# Patient Record
Sex: Male | Born: 1985 | Race: White | Hispanic: No | Marital: Single | Smoking: Former smoker
Health system: Southern US, Community
[De-identification: ages and names within clinical notes are randomized; demographics above are authoritative.]

## PROBLEM LIST (undated history)

## (undated) DIAGNOSIS — E7800 Pure hypercholesterolemia, unspecified: Secondary | ICD-10-CM

## (undated) HISTORY — DX: Pure hypercholesterolemia, unspecified: E78.00

## (undated) HISTORY — PX: APPENDECTOMY: SHX54

---
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
## 2005-01-25 ENCOUNTER — Emergency Department (HOSPITAL_COMMUNITY): Admission: EM | Admit: 2005-01-25 | Discharge: 2005-01-25 | Payer: Self-pay | Admitting: Emergency Medicine

## 2005-02-04 ENCOUNTER — Emergency Department (HOSPITAL_COMMUNITY): Admission: EM | Admit: 2005-02-04 | Discharge: 2005-02-04 | Payer: Self-pay | Admitting: Emergency Medicine

## 2005-11-29 IMAGING — CT CT ABDOMEN W/ CM
1 of 5 series · 14 of 32 positions shown, 19 images · IV contrast (CONTRAST)
Comparison: None

ABDOMEN CT WITH CONTRAST

CLINICAL DATA: Vomiting, right lower quadrant pain
TECHNIQUE: Multidetector CT imaging of the abdomen and pelvis was performed
following the standard protocol during bolus administration of intravenous
contrast.

Contrast:  100 cc Omnipaque 300

[Series 2636: — · axial · 0.70mm/px · z∈[+1183,+1655]mm · 14 of 333 slices shown, 19 images]
[im 19/333  soft-tissue]
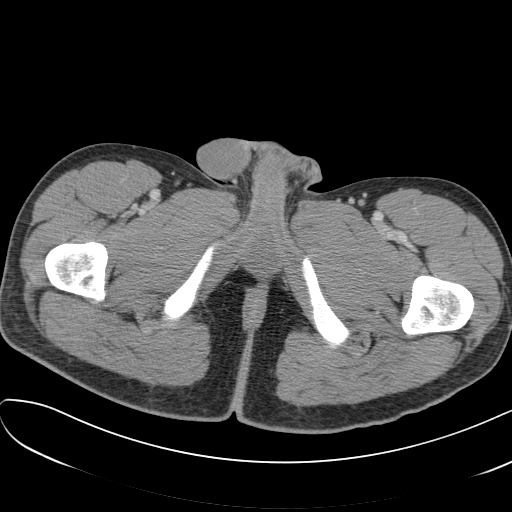
[im 19/333  bone]
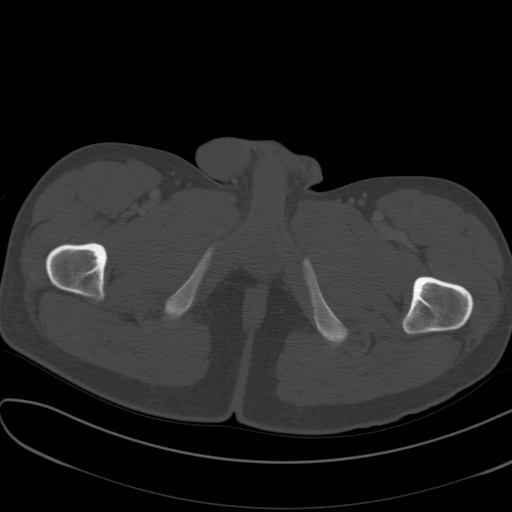
[im 37/333  soft-tissue]
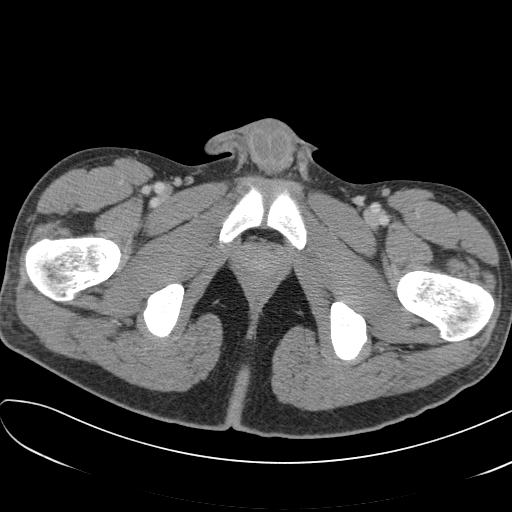
[im 74/333  soft-tissue]
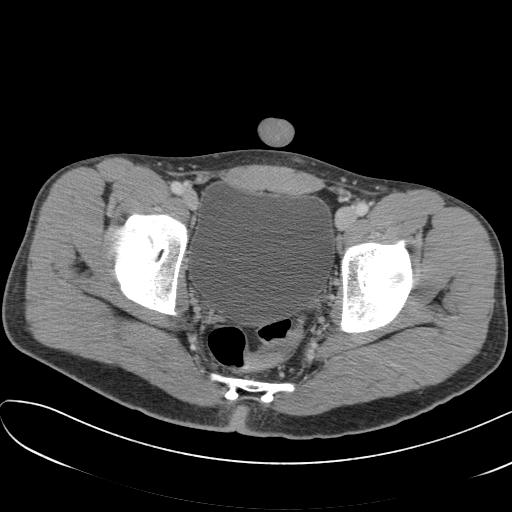
[im 93/333  soft-tissue]
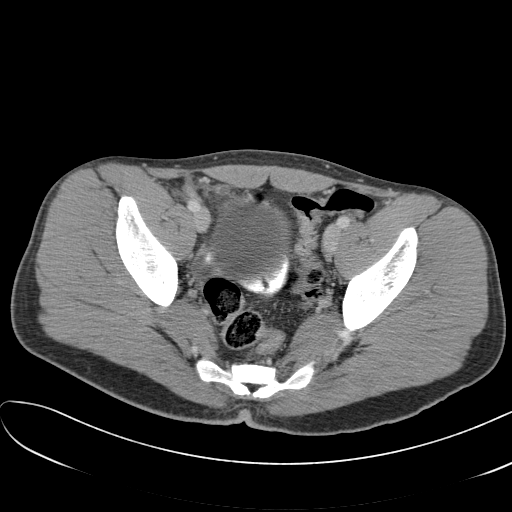
[im 111/333  soft-tissue]
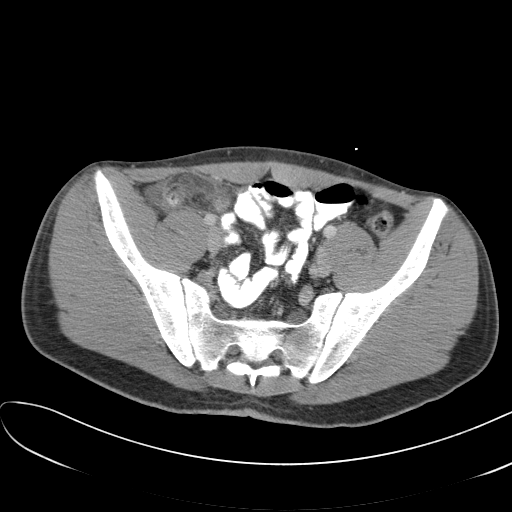
[im 148/333  soft-tissue]
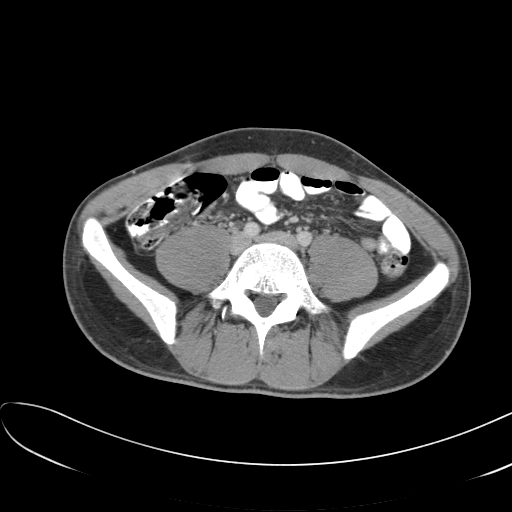
[im 167/333  soft-tissue]
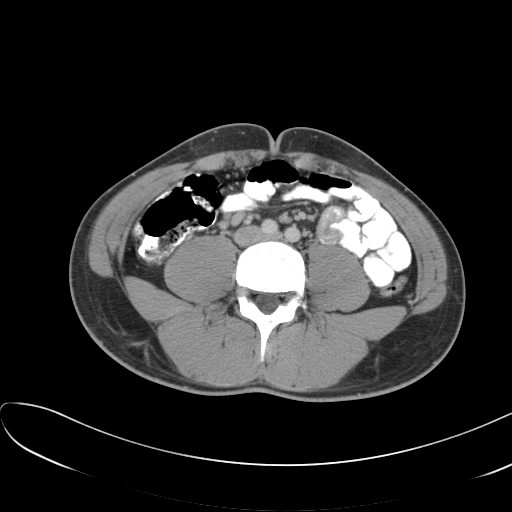
[im 185/333  soft-tissue]
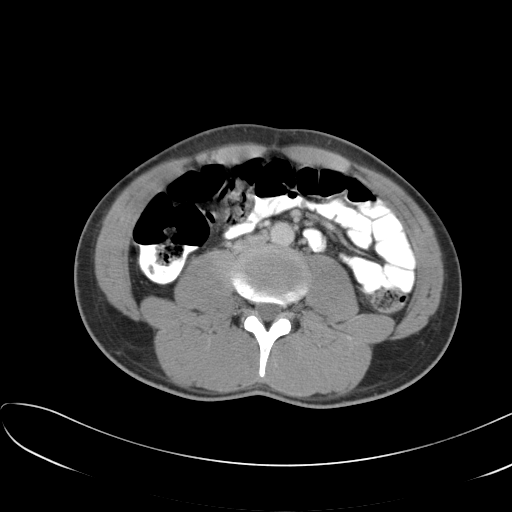
[im 222/333  soft-tissue]
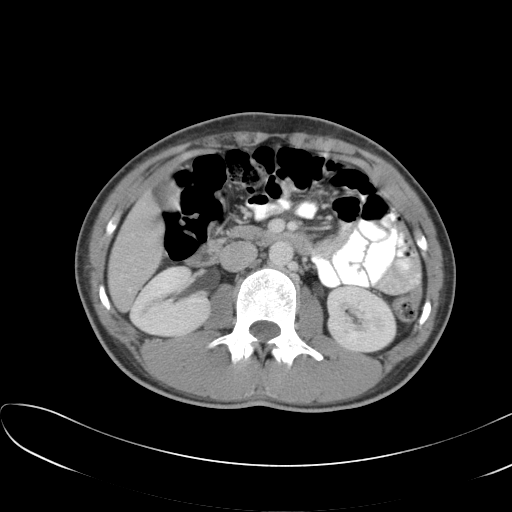
[im 222/333  bone]
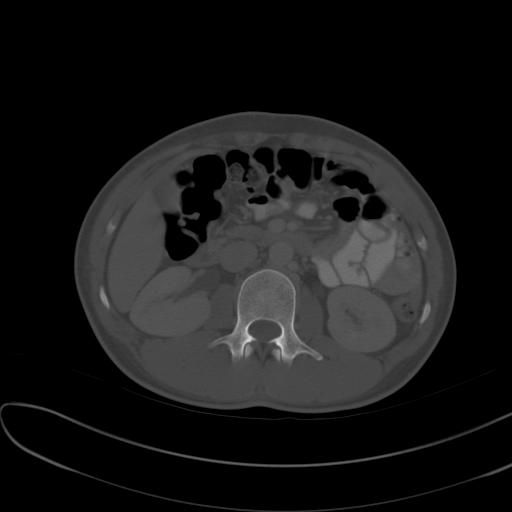
[im 240/333  soft-tissue]
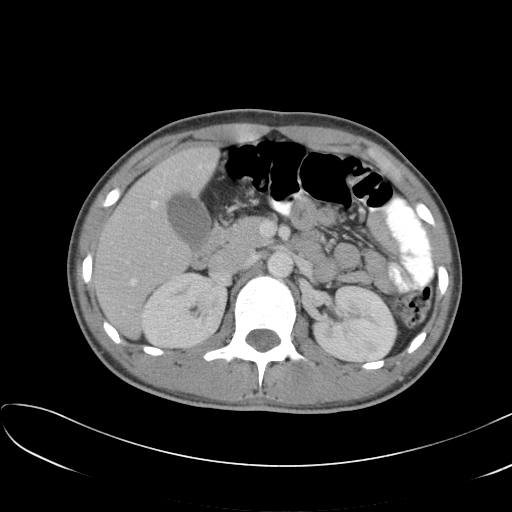
[im 259/333  soft-tissue]
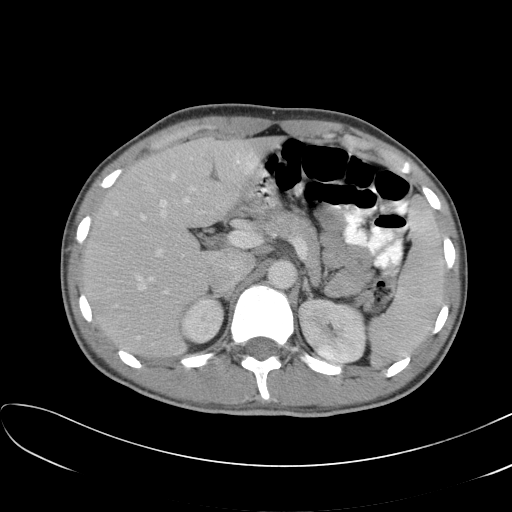
[im 259/333  lung]
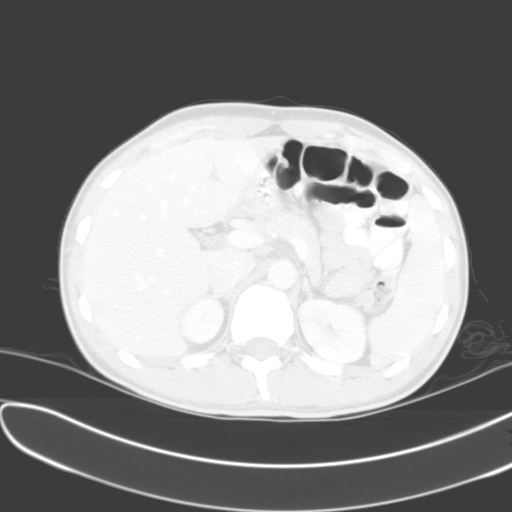
[im 277/333  lung]
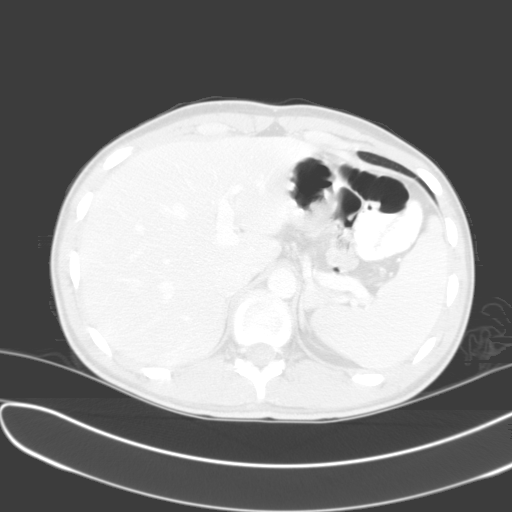
[im 296/333  soft-tissue]
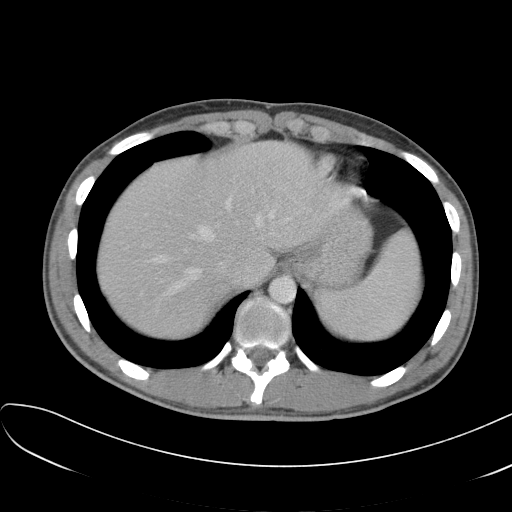
[im 296/333  lung]
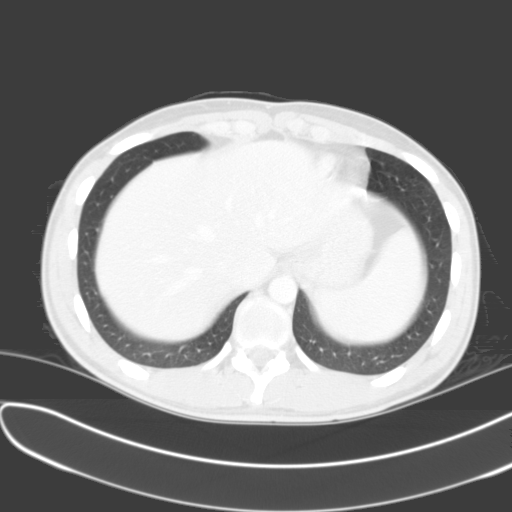
[im 314/333  soft-tissue]
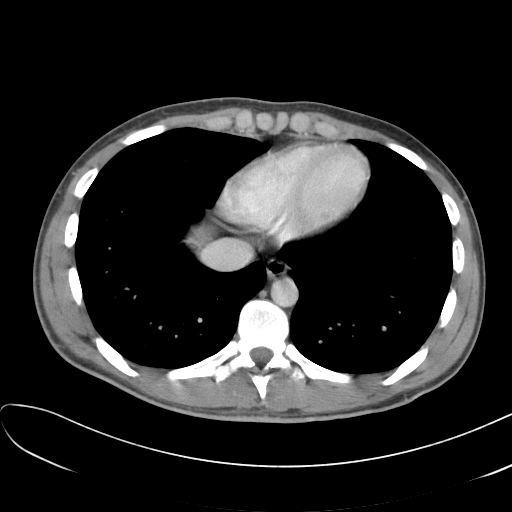
[im 314/333  lung]
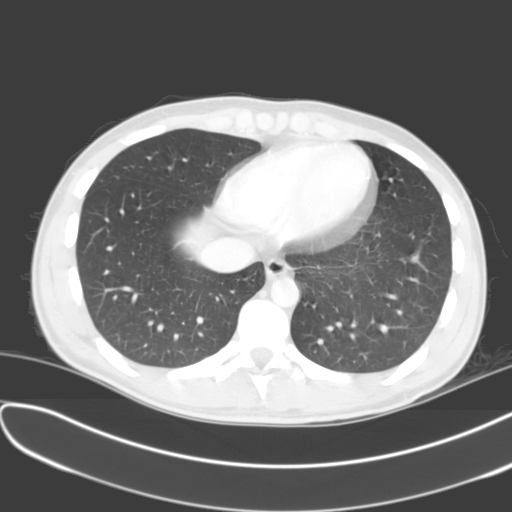

[14 of 32 positions shown; findings below may reference images not displayed]

FINDINGS: Liver, spleen, pancreas, adrenals, kidneys, gallbladder, and bowel
grossly unremarkable. No free fluid, free air, or adenopathy. Lung bases clear.

IMPRESSION

No acute findings in the abdomen.

PELVIS CT WITH CONTRAST
FINDINGS: The appendix is dilated and inflamed. Air-fluid level appears to be
present in the appendix or this may also represent a small contained
perforation. Findings compatible with appendicitis. There likely is an
appendicolith near the base of the appendix. Probable reactive thickening of the
terminal ileum. No free fluid or adenopathy.

IMPRESSION

Findings compatible with appendicitis. Probable reactive thickening of the
terminal ileum.

## 2010-10-30 ENCOUNTER — Emergency Department (HOSPITAL_COMMUNITY)
Admission: EM | Admit: 2010-10-30 | Discharge: 2010-10-31 | Disposition: A | Discharge status: Home / Self Care | Payer: Self-pay | Attending: Emergency Medicine | Admitting: Emergency Medicine

## 2010-10-30 DIAGNOSIS — R1032 Left lower quadrant pain: Secondary | ICD-10-CM | POA: Insufficient documentation

## 2010-10-30 DIAGNOSIS — R1084 Generalized abdominal pain: Secondary | ICD-10-CM

## 2010-10-30 DIAGNOSIS — F17200 Nicotine dependence, unspecified, uncomplicated: Secondary | ICD-10-CM | POA: Insufficient documentation

## 2010-10-30 LAB — URINALYSIS, ROUTINE W REFLEX MICROSCOPIC
Bilirubin Urine: NEGATIVE
Glucose, UA: NEGATIVE mg/dL
Hgb urine dipstick: NEGATIVE
Ketones, ur: NEGATIVE mg/dL
Leukocytes, UA: NEGATIVE
Nitrite: NEGATIVE
Protein, ur: NEGATIVE mg/dL
Specific Gravity, Urine: 1.02 (ref 1.005–1.030)
Urobilinogen, UA: 2 mg/dL — ABNORMAL HIGH (ref 0.0–1.0)
pH: 7.5 (ref 5.0–8.0)

## 2010-10-30 MED ORDER — IBUPROFEN 800 MG PO TABS
800.0000 mg | ORAL_TABLET | Freq: Once | ORAL | Status: AC
  Administered 2010-10-30: 800 mg via ORAL
  Filled 2010-10-30: qty 1

## 2010-10-30 MED ORDER — HYDROCODONE-ACETAMINOPHEN 5-325 MG PO TABS
1.0000 | ORAL_TABLET | Freq: Once | ORAL | Status: AC
  Administered 2010-10-30: 1 via ORAL
  Filled 2010-10-30: qty 1

## 2010-10-30 NOTE — ED Notes (Signed)
Ab pain x1 days, describes as pressure, denies any nausea, no vomiting, no diarrhea, no fever.

## 2010-10-30 NOTE — ED Provider Notes (Signed)
History     Chief Complaint  Patient presents with  . 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 He had lifted a heavy item yesterday morning and by yesterday afternoon was experiencing LLQ abdominal pain worse with movement. Took ibuprofen last night with little relief. Pain has continued to day with certain movements. Denies fever, chills, hematuria, frequncy, urgency, nausea, vomiting.  Patient is a 25 y.o. male presenting with abdominal pain. The history is provided by the patient.  Abdominal Pain The primary symptoms of the illness include abdominal pain. The current episode started yesterday.  Associated with: movement. Symptoms associated with the illness do not include constipation, hematuria or back pain.    History reviewed. No pertinent past medical history.  Past Surgical History  Procedure Date  . Appendectomy     History reviewed. No pertinent family history.  History  Substance Use Topics  . Smoking status: Current Everyday Smoker  . Smokeless tobacco: Not on file  . Alcohol Use: No      Review of Systems  Gastrointestinal: Positive for abdominal pain. Negative for constipation.  Genitourinary: Negative for hematuria.  Musculoskeletal: Negative for back pain.  All other systems reviewed and are negative.    Physical Exam  BP 123/58  Pulse 50  Temp(Src) 98.3 F (36.8 C) (Oral)  Resp 17  Ht 6\' 4"  (1.93 m)  Wt 183 lb (83.008 kg)  BMI 22.28 kg/m2  SpO2 99%  Physical Exam  Nursing note and vitals reviewed. Constitutional: He is oriented to person, place, and time. He appears well-developed and well-nourished. No distress.  HENT:  Head: Normocephalic.  Right Ear: External ear normal.  Left Ear: External ear normal.  Nose: Nose normal.  Mouth/Throat: Oropharynx is clear and moist.  Eyes: Conjunctivae and EOM are normal. Pupils are equal, round, and reactive to light.  Neck: Normal range of motion. Neck supple.    Cardiovascular: Normal rate, normal heart sounds and intact distal pulses.   Pulmonary/Chest: Effort normal and breath sounds normal.  Abdominal: Soft. He exhibits no mass. There is no rebound and no guarding.       Mild tenderness to LLQ with palpation  Genitourinary: Penis normal.  Musculoskeletal: Normal range of motion.  Neurological: He is alert and oriented to person, place, and time. He has normal reflexes.  Skin: Skin is warm and dry.    ED Course  Procedures  MDM       Nicoletta Dress. Colon Macnaughton, MD 10/30/10 [phone]

## 2010-10-31 MED ORDER — HYDROCODONE-ACETAMINOPHEN 5-500 MG PO TABS: 1.0000 | ORAL_TABLET | ORAL | Status: AC | PRN

## 2010-10-31 MED ORDER — IBUPROFEN 800 MG PO TABS: 800.0000 mg | ORAL_TABLET | Freq: Three times a day (TID) | ORAL | Status: AC

## 2010-10-31 NOTE — Progress Notes (Signed)
Patient stated that pain has improved a bit with medication.

## 2010-10-31 NOTE — ED Notes (Signed)
States he doesn't have pain while he is sitting down but pain increases when walking

## 2012-05-22 ENCOUNTER — Other Ambulatory Visit: Payer: Self-pay | Admitting: Occupational Medicine

## 2012-05-22 ENCOUNTER — Ambulatory Visit: Payer: Self-pay

## 2012-05-22 DIAGNOSIS — Z021 Encounter for pre-employment examination: Secondary | ICD-10-CM

## 2013-10-25 IMAGING — CR DG CHEST 1V
1 series · 1 of 1 positions shown · non-contrast
Comparison: None.

CLINICAL DATA: Pre-employment physical exam.

CHEST - 1 VIEW

[view not recorded]
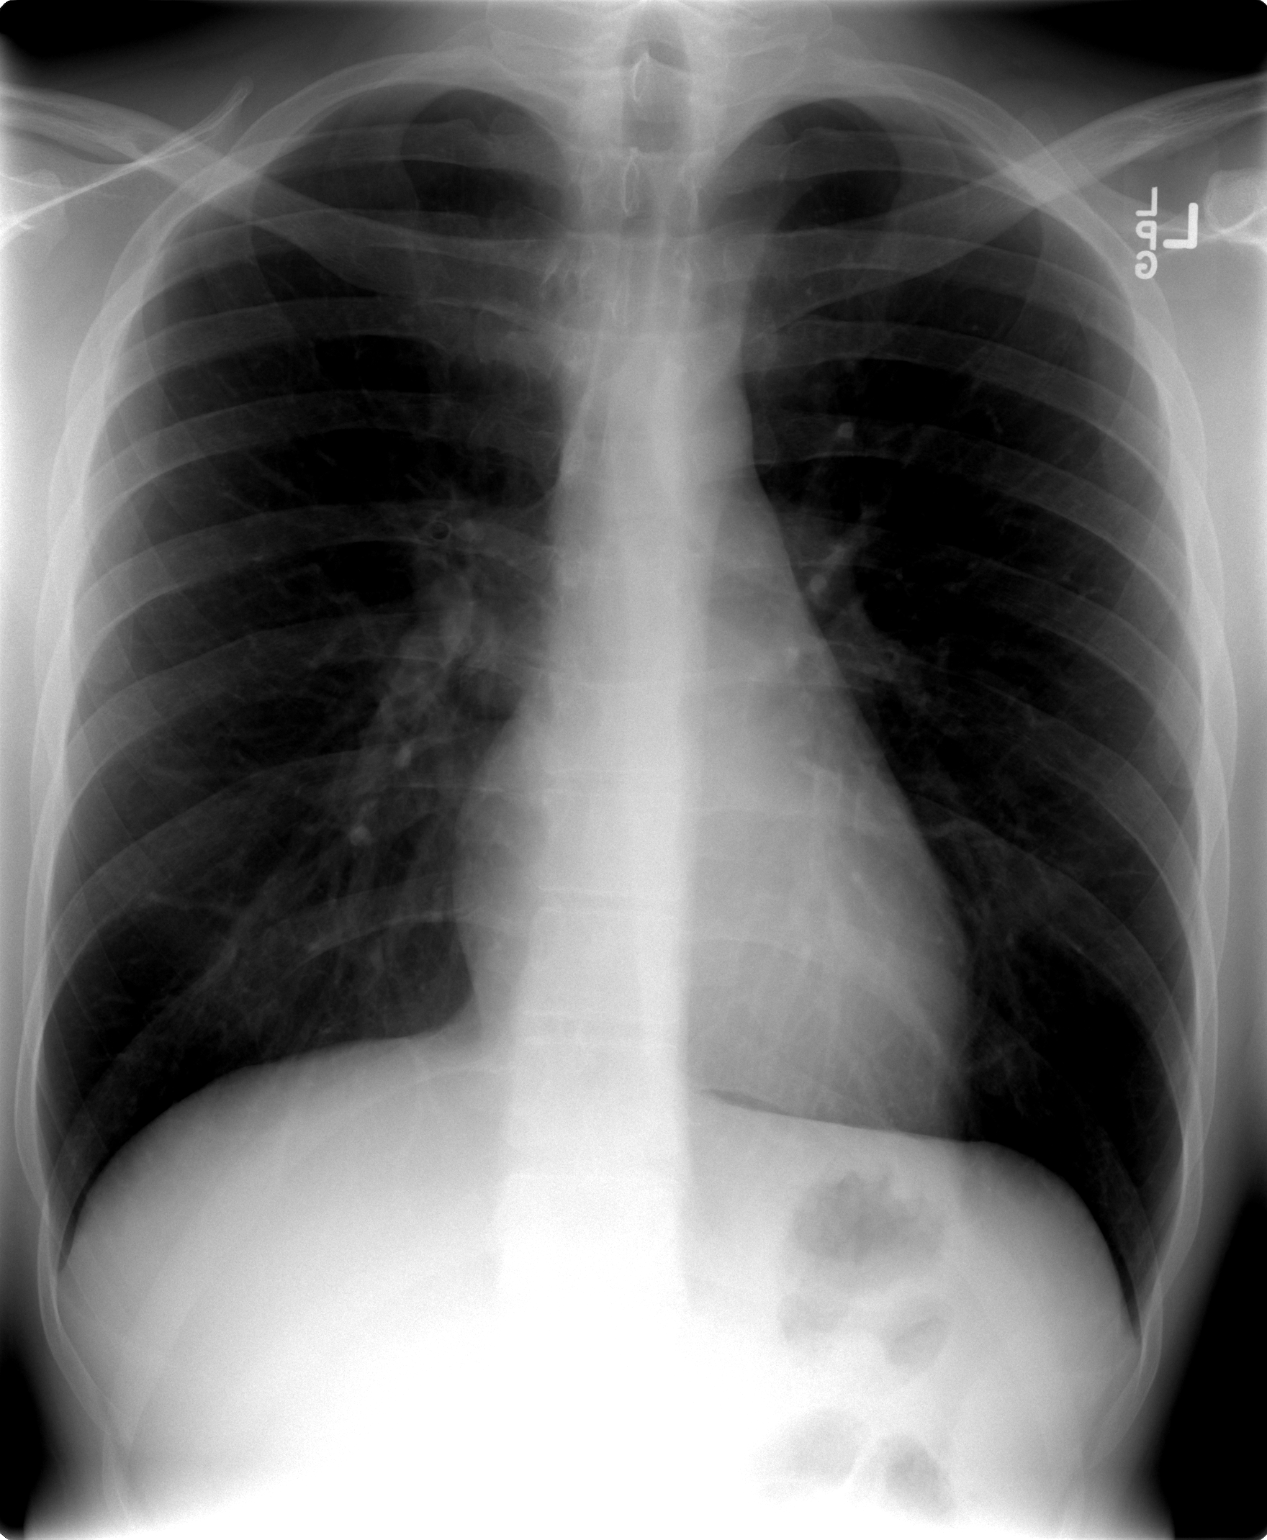

[1 of 1 positions shown; findings below may reference images not displayed]

FINDINGS: Lungs are clear.  Heart size is normal.  No pneumothorax
or pleural fluid.
IMPRESSION: Negative chest.

## 2019-04-29 NOTE — Progress Notes (Signed)
H&P  Chief Complaint: Desire for vasectomy  History of Present Illness: 34 year old male presents for vasectomy consult. He has fathered 4 children. His wife/partner's name is [place]. His employment--concrete truck driver.  No past medical history on file.  Past Surgical History:  Procedure Laterality Date  . APPENDECTOMY      Home Medications:  Allergies as of 04/30/2019   No Known Allergies     Medication List    as of April 29, 2019 12:01 PM   You have not been prescribed any medications.     Allergies: No Known Allergies  No family history on file.  Social History:  reports that he has been smoking. He does not have any smokeless tobacco history on file. He reports that he does not drink alcohol or use drugs.  ROS: A complete review of systems was performed.  All systems are negative except for pertinent findings as noted.  Physical Exam:  Vital signs in last 24 hours: There were no vitals taken for this visit. Constitutional:  Alert and oriented, No acute distress Cardiovascular: Regular rate  Respiratory: Normal respiratory effort Genitourinary: Normal male phallus, testes are descended bilaterally and non-tender and without masses, scrotum is normal in appearance without lesions or masses, perineum is normal on inspection. Vasa both palpable. Lymphatic: No lymphadenopathy Neurologic: Grossly intact, no focal deficits Psychiatric: Normal mood and affect  Laboratory Data:  No results for input(s): WBC, HGB, HCT, PLT in the last 72 hours.  No results for input(s): NA, K, CL, GLUCOSE, BUN, CALCIUM, CREATININE in the last 72 hours.  Invalid input(s): CO3   No results found for this or any previous visit (from the past 24 hour(s)). No results found for this or any previous visit (from the past 240 hour(s)).  Renal Function: No results for input(s): CREATININE in the last 168 hours. CrCl cannot be calculated (No successful lab value  found.).  Radiologic Imaging: No results found.  Impression/Assessment:  Desire for vasectomy  Plan:  1. We discussed the procedure in detail, and I provided a vasectomy information packet.  He understands that this is a permanent form of sterilization. 2.  We discussed that vasectomy does not produce immediate infertility and he will need his semen sample showing no sperm before he can stop using any other form of contraception.  This typically takes 3 months. 3.  We discussed that there is a < 1/100 failure rate. 4.  I advised him to refrain from ejaculating for approximately 1 week. 5.  There is less than a 1% chance of developing a scrotal hematoma and equal chance of developing chronic scrotal pain. 6.   We will schedule his vasectomy at his convenience.

## 2019-04-30 ENCOUNTER — Other Ambulatory Visit: Payer: Self-pay

## 2019-04-30 ENCOUNTER — Ambulatory Visit (INDEPENDENT_AMBULATORY_CARE_PROVIDER_SITE_OTHER): Payer: 59 | Admitting: Urology

## 2019-04-30 ENCOUNTER — Encounter: Payer: Self-pay | Admitting: Urology

## 2019-04-30 VITALS — BP 118/68 | HR 76 | Temp 97.7°F | Ht 76.0 in | Wt 230.0 lb

## 2019-04-30 DIAGNOSIS — Z3009 Encounter for other general counseling and advice on contraception: Secondary | ICD-10-CM

## 2019-04-30 NOTE — Patient Instructions (Addendum)

## 2019-04-30 NOTE — Progress Notes (Signed)

## 2019-05-01 ENCOUNTER — Encounter: Payer: Self-pay | Admitting: Urology

## 2019-06-18 ENCOUNTER — Ambulatory Visit (INDEPENDENT_AMBULATORY_CARE_PROVIDER_SITE_OTHER): Payer: 59 | Admitting: Urology

## 2019-06-18 ENCOUNTER — Other Ambulatory Visit: Payer: Self-pay

## 2019-06-18 ENCOUNTER — Encounter: Payer: Self-pay | Admitting: Urology

## 2019-06-18 VITALS — BP 133/79 | HR 77 | Temp 98.2°F | Ht 76.0 in | Wt 230.0 lb

## 2019-06-18 DIAGNOSIS — Z302 Encounter for sterilization: Secondary | ICD-10-CM | POA: Diagnosis not present

## 2019-06-18 NOTE — Procedures (Signed)
The patient has reviewed information regarding the risks and complications and understands the advantages and disadvantages of elective sterilization. With full informed written and oral consent, he has elected to proceed with vasectomy. The patient was placed in the supine position, and the genitalia was sterilely prepped with betadine and draped; 1% lidocaine was used for local anesthesia of the skin and perivasal tissue in the midline. The right vas was then grasped through the skin using the non-piercing vas clamp, the skin was pierced with a single blade of the sharpened hemostat. The sharpened hemostat was then placed through this incision, and the perivasal tissue cleared from around the vas itself. The vas was then delivered through the wound and clamped with a vas clamp, a segment of approximately 1 cm of vasal tissue was cleared, and a 2-0 chromic suture was then passed beneath the vas. The chromic suture was then tied proximally, and a second one was tied distally, isolating the approximately 1 cm segment of vas. I then excised the segment of vas and cauterized the proximal and distal lumens with the eye cautery. The excess suture was then cut, and the ligated ends were covered with dartos fascia and buried, and the vas was allowed to drop back into the scrotum after observation for any bleeding. The contralateral vas was then treated in an identical fashion. Any further bleeding points were cauterized. The wound was then observed for any bleeding, the skin edges compressed, and none was noted. The patient tolerated the procedure well and without complications.   A sterile gauze dressing was applied as well as a form of scrotal supporting clothing. He was sent home with instructions regarding wound care and signs of infection, and recommended to restrict activity for 48 hours. .  He was instructed to use ice for the next 12 hours.  He was also instructed to call immediately for excessive bleeding,  swelling, drainage, discomfort, fever or chills. He was informed he should not consider himself sterile until a  semen analysis at 12 weeks has  been noted to be completely free of sperm.  

## 2019-06-18 NOTE — Progress Notes (Signed)

## 2019-06-18 NOTE — Patient Instructions (Signed)
